# Patient Record
Sex: Male | Born: 1975 | Race: Black or African American | Hispanic: No | Marital: Married | Smoking: Former smoker
Health system: Southern US, Community
[De-identification: ages and names within clinical notes are randomized; demographics above are authoritative.]

## PROBLEM LIST (undated history)

## (undated) DIAGNOSIS — K219 Gastro-esophageal reflux disease without esophagitis: Secondary | ICD-10-CM

## (undated) DIAGNOSIS — R12 Heartburn: Secondary | ICD-10-CM

---

## 2009-07-09 ENCOUNTER — Emergency Department (HOSPITAL_BASED_OUTPATIENT_CLINIC_OR_DEPARTMENT_OTHER): Admission: EM | Admit: 2009-07-09 | Discharge: 2009-07-09 | Payer: Self-pay | Admitting: Emergency Medicine

## 2009-07-09 ENCOUNTER — Ambulatory Visit: Payer: Self-pay | Admitting: Diagnostic Radiology

## 2010-04-29 LAB — POCT CARDIAC MARKERS
Myoglobin, poc: 122 ng/mL (ref 12–200)
Troponin i, poc: <0.05 ng/mL (ref 0.00–0.09)

## 2010-04-29 LAB — CBC
HCT: 46.5 % (ref 39.0–52.0)
Hemoglobin: 15.4 g/dL (ref 13.0–17.0)
MCV: 83.6 fL (ref 78.0–100.0)
WBC: 13.8 10*3/uL — ABNORMAL HIGH (ref 4.0–10.5)

## 2010-04-29 LAB — DIFFERENTIAL
Basophils Absolute: 0.1 10*3/uL (ref 0.0–0.1)
Lymphs Abs: 0.9 10*3/uL (ref 0.7–4.0)
Monocytes Absolute: 0.7 10*3/uL (ref 0.1–1.0)
Monocytes Relative: 5 % (ref 3–12)
Neutro Abs: 12.1 10*3/uL — ABNORMAL HIGH (ref 1.7–7.7)
Neutrophils Relative %: 88 % — ABNORMAL HIGH (ref 43–77)

## 2010-04-29 LAB — COMPREHENSIVE METABOLIC PANEL
ALT: 12 U/L (ref 0–53)
Albumin: 4.4 g/dL (ref 3.5–5.2)
CO2: 22 mEq/L (ref 19–32)
Calcium: 9.6 mg/dL (ref 8.4–10.5)
GFR calc Af Amer: >60 mL/min (ref >60)
GFR calc non Af Amer: >60 mL/min (ref >60)
Total Bilirubin: 0.6 mg/dL (ref 0.3–1.2)
Total Protein: 7.7 g/dL (ref 6.0–8.3)

## 2013-05-07 ENCOUNTER — Encounter (HOSPITAL_BASED_OUTPATIENT_CLINIC_OR_DEPARTMENT_OTHER): Payer: Self-pay | Admitting: Emergency Medicine

## 2013-05-07 ENCOUNTER — Emergency Department (HOSPITAL_BASED_OUTPATIENT_CLINIC_OR_DEPARTMENT_OTHER)
Admission: EM | Admit: 2013-05-07 | Discharge: 2013-05-07 | Disposition: A | Discharge status: Home / Self Care | Payer: Self-pay | Attending: Emergency Medicine | Admitting: Emergency Medicine

## 2013-05-07 DIAGNOSIS — K5289 Other specified noninfective gastroenteritis and colitis: Secondary | ICD-10-CM | POA: Insufficient documentation

## 2013-05-07 DIAGNOSIS — R231 Pallor: Secondary | ICD-10-CM | POA: Insufficient documentation

## 2013-05-07 DIAGNOSIS — F17200 Nicotine dependence, unspecified, uncomplicated: Secondary | ICD-10-CM | POA: Insufficient documentation

## 2013-05-07 DIAGNOSIS — Z79899 Other long term (current) drug therapy: Secondary | ICD-10-CM | POA: Insufficient documentation

## 2013-05-07 DIAGNOSIS — R12 Heartburn: Secondary | ICD-10-CM | POA: Insufficient documentation

## 2013-05-07 DIAGNOSIS — K529 Noninfective gastroenteritis and colitis, unspecified: Secondary | ICD-10-CM

## 2013-05-07 HISTORY — DX: Heartburn: R12

## 2013-05-07 LAB — CBC WITH DIFFERENTIAL/PLATELET
BASOS ABS: 0 10*3/uL (ref 0.0–0.1)
Basophils Relative: 0 % (ref 0–1)
EOS ABS: 0 10*3/uL (ref 0.0–0.7)
EOS PCT: 0 % (ref 0–5)
HEMATOCRIT: 46.1 % (ref 39.0–52.0)
Hemoglobin: 15.9 g/dL (ref 13.0–17.0)
LYMPHS PCT: 6 % — AB (ref 12–46)
Lymphs Abs: 0.8 10*3/uL (ref 0.7–4.0)
MCH: 27.8 pg (ref 26.0–34.0)
MCHC: 34.5 g/dL (ref 30.0–36.0)
MCV: 80.7 fL (ref 78.0–100.0)
MONO ABS: 0.6 10*3/uL (ref 0.1–1.0)
Monocytes Relative: 5 % (ref 3–12)
NEUTROS PCT: 89 % — AB (ref 43–77)
Neutro Abs: 11.4 10*3/uL — ABNORMAL HIGH (ref 1.7–7.7)
Platelets: 235 10*3/uL (ref 150–400)
RBC: 5.71 MIL/uL (ref 4.22–5.81)
RDW: 12.7 % (ref 11.5–15.5)
WBC: 12.8 10*3/uL — AB (ref 4.0–10.5)

## 2013-05-07 LAB — COMPREHENSIVE METABOLIC PANEL
ALT: 14 U/L (ref 0–53)
AST: 35 U/L (ref 0–37)
Albumin: 4.8 g/dL (ref 3.5–5.2)
Alkaline Phosphatase: 89 U/L (ref 39–117)
BILIRUBIN TOTAL: 0.8 mg/dL (ref 0.3–1.2)
BUN: 15 mg/dL (ref 6–23)
CALCIUM: 10.4 mg/dL (ref 8.4–10.5)
CO2: 24 mEq/L (ref 19–32)
CREATININE: 1.1 mg/dL (ref 0.50–1.35)
Chloride: 99 mEq/L (ref 96–112)
GFR calc non Af Amer: 85 mL/min — ABNORMAL LOW (ref >90)
Glucose, Bld: 155 mg/dL — ABNORMAL HIGH (ref 70–99)
Potassium: 4.2 mEq/L (ref 3.7–5.3)
Sodium: 139 mEq/L (ref 137–147)
Total Protein: 8.5 g/dL — ABNORMAL HIGH (ref 6.0–8.3)

## 2013-05-07 LAB — LIPASE, BLOOD: LIPASE: 36 U/L (ref 11–59)

## 2013-05-07 MED ORDER — PROMETHAZINE HCL 25 MG PO TABS: 25.0000 mg | ORAL_TABLET | Freq: Four times a day (QID) | ORAL | Status: DC | PRN

## 2013-05-07 MED ORDER — GI COCKTAIL ~~LOC~~
30.0000 mL | Freq: Once | ORAL | Status: AC
  Administered 2013-05-07: 30 mL via ORAL

## 2013-05-07 MED ORDER — KETOROLAC TROMETHAMINE 30 MG/ML IJ SOLN
30.0000 mg | Freq: Once | INTRAMUSCULAR | Status: AC
  Administered 2013-05-07: 30 mg via INTRAVENOUS
  Filled 2013-05-07: qty 1

## 2013-05-07 MED ORDER — ONDANSETRON HCL 4 MG/2ML IJ SOLN
4.0000 mg | Freq: Once | INTRAMUSCULAR | Status: AC
  Administered 2013-05-07: 4 mg via INTRAVENOUS
  Filled 2013-05-07: qty 2

## 2013-05-07 MED ORDER — GI COCKTAIL ~~LOC~~
30.0000 mL | Freq: Once | ORAL | Status: AC
  Administered 2013-05-07: 30 mL via ORAL
  Filled 2013-05-07: qty 30

## 2013-05-07 MED ORDER — SODIUM CHLORIDE 0.9 % IV BOLUS (SEPSIS)
1000.0000 mL | Freq: Once | INTRAVENOUS | Status: AC
  Administered 2013-05-07: 1000 mL via INTRAVENOUS

## 2013-05-07 MED ORDER — GI COCKTAIL ~~LOC~~
ORAL | Status: AC
  Administered 2013-05-07: 30 mL via ORAL
  Filled 2013-05-07: qty 30

## 2013-05-07 NOTE — Discharge Instructions (Signed)
Phenergan as prescribed as needed for nausea.  Prilosec 20 mg twice daily for the next 2 weeks.  Return to the ER if you develop abdominal pain, bloody stool, or other new or concerning symptoms.   Viral Gastroenteritis Viral gastroenteritis is also known as stomach flu. This condition affects the stomach and intestinal tract. It can cause sudden diarrhea and vomiting. The illness typically lasts 3 to 8 days. Most people develop an immune response that eventually gets rid of the virus. While this natural response develops, the virus can make you quite ill. CAUSES  Many different viruses can cause gastroenteritis, such as rotavirus or noroviruses. You can catch one of these viruses by consuming contaminated food or water. You may also catch a virus by sharing utensils or other personal items with an infected person or by touching a contaminated surface. SYMPTOMS  The most common symptoms are diarrhea and vomiting. These problems can cause a severe loss of body fluids (dehydration) and a body salt (electrolyte) imbalance. Other symptoms may include:  Fever.  Headache.  Fatigue.  Abdominal pain. DIAGNOSIS  Your caregiver can usually diagnose viral gastroenteritis based on your symptoms and a physical exam. A stool sample may also be taken to test for the presence of viruses or other infections. TREATMENT  This illness typically goes away on its own. Treatments are aimed at rehydration. The most serious cases of viral gastroenteritis involve vomiting so severely that you are not able to keep fluids down. In these cases, fluids must be given through an intravenous line (IV). HOME CARE INSTRUCTIONS   Drink enough fluids to keep your urine clear or pale yellow. Drink small amounts of fluids frequently and increase the amounts as tolerated.  Ask your caregiver for specific rehydration instructions.  Avoid:  Foods high in sugar.  Alcohol.  Carbonated  drinks.  Tobacco.  Juice.  Caffeine drinks.  Extremely hot or cold fluids.  Fatty, greasy foods.  Too much intake of anything at one time.  Dairy products until 24 to 48 hours after diarrhea stops.  You may consume probiotics. Probiotics are active cultures of beneficial bacteria. They may lessen the amount and number of diarrheal stools in adults. Probiotics can be found in yogurt with active cultures and in supplements.  Wash your hands well to avoid spreading the virus.  Only take over-the-counter or prescription medicines for pain, discomfort, or fever as directed by your caregiver. Do not give aspirin to children. Antidiarrheal medicines are not recommended.  Ask your caregiver if you should continue to take your regular prescribed and over-the-counter medicines.  Keep all follow-up appointments as directed by your caregiver. SEEK IMMEDIATE MEDICAL CARE IF:   You are unable to keep fluids down.  You do not urinate at least once every 6 to 8 hours.  You develop shortness of breath.  You notice blood in your stool or vomit. This may look like coffee grounds.  You have abdominal pain that increases or is concentrated in one small area (localized).  You have persistent vomiting or diarrhea.  You have a fever.  The patient is a child younger than 3 months, and he or she has a fever.  The patient is a child older than 3 months, and he or she has a fever and persistent symptoms.  The patient is a child older than 3 months, and he or she has a fever and symptoms suddenly get worse.  The patient is a baby, and he or she has no tears when  crying. MAKE SURE YOU:   Understand these instructions.  Will watch your condition.  Will get help right away if you are not doing well or get worse. Document Released: 01/27/2005 Document Revised: 04/21/2011 Document Reviewed: 11/13/2010 [facility] Patient Information 2014 [place].

## 2013-05-07 NOTE — ED Provider Notes (Signed)
CSN: [id number]     Arrival date & time 05/07/13  1555 History   First MD Initiated Contact with Patient 05/07/13 1624    This chart was scribed for Jeremy Lyonsouglas Shwanda Soltis, MD by Marica OtterNusrat Rahman, ED Scribe. This patient was seen in room MH03/MH03 and the patient's care was started at 4:27 PM.  No primary provider on file.  Chief Complaint  Patient presents with  . Heartburn   The history is provided by the patient. No language interpreter was used.   HPI Comments: Jeremy HatchKevin Foster is a 38 y.o. male who presents to the Emergency Department complaining of intermittent abd pain onset earlier today. Pt describes the pain as a "pressure" sensation in the entirety of his abd. Pt also complains of associated nausea, chills, diaphoresis, vomiting and diarrhea. Pt reports he had two beers last night. Pt denies being a daily drinker and states he drinks predominately on the weekends. Pt reports he has not taken any food or fluids today. Pt is a current, every day smoker.   Sick Contact: Wife has had stomach pain onset one week ago.   Past Medical History  Diagnosis Date  . Heartburn    History reviewed. No pertinent past surgical history. No family history on file. History  Substance Use Topics  . Smoking status: Current Every Day Smoker    Types: Cigarettes  . Smokeless tobacco: Never Used  . Alcohol Use: 1.2 oz/week    2 Cans of beer per week    Review of Systems  Constitutional: Positive for chills.  Gastrointestinal: Positive for nausea, vomiting and diarrhea.   Allergies  Review of patient's allergies indicates no known allergies.  Home Medications   Current Outpatient Rx  Name  Route  Sig  Dispense  Refill  . ranitidine (ZANTAC) 75 MG tablet   Oral   Take 75 mg by mouth 2 (two) times daily.          BP 160/88  Pulse 56  Temp(Src) 98.2 F (36.8 C) (Oral)  Resp 24  SpO2 100% Physical Exam  Nursing note and vitals reviewed. Constitutional: He is oriented to person, place, and time.  He appears well-developed and well-nourished. No distress.  HENT:  Head: Normocephalic and atraumatic.  Eyes: EOM are normal.  Neck: Neck supple. No tracheal deviation present.  Cardiovascular: Normal rate.   Pulmonary/Chest: Effort normal. No respiratory distress.  Abdominal: Soft. Bowel sounds are normal. He exhibits no distension and no mass. There is no tenderness. There is no rebound and no guarding.  Mucous membrane moist.   Musculoskeletal: Normal range of motion.  Neurological: He is alert and oriented to person, place, and time.  Skin: Skin is warm and dry. There is pallor.  Psychiatric: He has a normal mood and affect. His behavior is normal.    ED Course  Procedures (including critical care time) DIAGNOSTIC STUDIES: Oxygen Saturation is 100% on RA, normal by my interpretation.    COORDINATION OF CARE: 4:32 PM-Discussed treatment plan which includes labs, meds, adminstering fluids via IVm,  with pt at bedside and pt agreed to plan.   Labs Review Labs Reviewed - No data to display Imaging Review No results found.   EKG Interpretation None      MDM   Final diagnoses:  None    Patient presents with complaints of burning in his stomach since 5:00 this morning. He has had vomiting since that time. He denies any fevers or chills. He has had some loose stools but denies  any blood. His symptoms sound like acute gastroenteritis with possible gastritis resulting from his vomiting. His workup is unremarkable and he is feeling better with fluids and anti-emetics. He appears appropriate for discharge. Understands to return if he develops bloody stool, worsening pain, or other new and concerning symptoms.  I personally performed the services described in this documentation, which was scribed in my presence. The recorded information has been reviewed and is accurate.       Jeremy Lyons, MD 05/07/13 2322

## 2013-05-07 NOTE — ED Notes (Signed)
Pt c/o heat burn since 0500- vomited x 11- states drank 2 beers last night- pt very anxious in triage

## 2015-08-28 ENCOUNTER — Emergency Department (HOSPITAL_BASED_OUTPATIENT_CLINIC_OR_DEPARTMENT_OTHER): Payer: BLUE CROSS/BLUE SHIELD

## 2015-08-28 ENCOUNTER — Emergency Department (HOSPITAL_BASED_OUTPATIENT_CLINIC_OR_DEPARTMENT_OTHER)
Admission: EM | Admit: 2015-08-28 | Discharge: 2015-08-28 | Disposition: A | Discharge status: Home / Self Care | Payer: BLUE CROSS/BLUE SHIELD | Attending: Emergency Medicine | Admitting: Emergency Medicine

## 2015-08-28 ENCOUNTER — Encounter (HOSPITAL_BASED_OUTPATIENT_CLINIC_OR_DEPARTMENT_OTHER): Payer: Self-pay | Admitting: *Deleted

## 2015-08-28 DIAGNOSIS — R1010 Upper abdominal pain, unspecified: Secondary | ICD-10-CM | POA: Diagnosis present

## 2015-08-28 DIAGNOSIS — F17210 Nicotine dependence, cigarettes, uncomplicated: Secondary | ICD-10-CM | POA: Insufficient documentation

## 2015-08-28 DIAGNOSIS — K2900 Acute gastritis without bleeding: Secondary | ICD-10-CM | POA: Diagnosis not present

## 2015-08-28 DIAGNOSIS — R0602 Shortness of breath: Secondary | ICD-10-CM | POA: Diagnosis not present

## 2015-08-28 LAB — COMPREHENSIVE METABOLIC PANEL
ALBUMIN: 4.8 g/dL (ref 3.5–5.0)
ALT: 15 U/L — ABNORMAL LOW (ref 17–63)
ANION GAP: 11 (ref 5–15)
AST: 35 U/L (ref 15–41)
Alkaline Phosphatase: 91 U/L (ref 38–126)
BILIRUBIN TOTAL: 0.8 mg/dL (ref 0.3–1.2)
BUN: 13 mg/dL (ref 6–20)
CO2: 23 mmol/L (ref 22–32)
Calcium: 9.7 mg/dL (ref 8.9–10.3)
Chloride: 102 mmol/L (ref 101–111)
Creatinine, Ser: 1.12 mg/dL (ref 0.61–1.24)
GFR calc Af Amer: >60 mL/min (ref >60)
Glucose, Bld: 157 mg/dL — ABNORMAL HIGH (ref 65–99)
POTASSIUM: 3.5 mmol/L (ref 3.5–5.1)
Sodium: 136 mmol/L (ref 135–145)
TOTAL PROTEIN: 8 g/dL (ref 6.5–8.1)

## 2015-08-28 LAB — CBC WITH DIFFERENTIAL/PLATELET
BASOS PCT: 0 %
Basophils Absolute: 0 10*3/uL (ref 0.0–0.1)
Eosinophils Absolute: 0 10*3/uL (ref 0.0–0.7)
Eosinophils Relative: 0 %
HEMATOCRIT: 43.9 % (ref 39.0–52.0)
Hemoglobin: 15.3 g/dL (ref 13.0–17.0)
Lymphocytes Relative: 8 %
Lymphs Abs: 0.9 10*3/uL (ref 0.7–4.0)
MCH: 27.9 pg (ref 26.0–34.0)
MCHC: 34.9 g/dL (ref 30.0–36.0)
MCV: 80.1 fL (ref 78.0–100.0)
MONO ABS: 0.6 10*3/uL (ref 0.1–1.0)
MONOS PCT: 6 %
NEUTROS ABS: 9.4 10*3/uL — AB (ref 1.7–7.7)
Neutrophils Relative %: 86 %
Platelets: 214 10*3/uL (ref 150–400)
RBC: 5.48 MIL/uL (ref 4.22–5.81)
RDW: 12.6 % (ref 11.5–15.5)
WBC: 10.9 10*3/uL — ABNORMAL HIGH (ref 4.0–10.5)

## 2015-08-28 LAB — LIPASE, BLOOD: LIPASE: 22 U/L (ref 11–51)

## 2015-08-28 MED ORDER — IOPAMIDOL (ISOVUE-300) INJECTION 61%
100.0000 mL | Freq: Once | INTRAVENOUS | Status: AC | PRN
  Administered 2015-08-28: 100 mL via INTRAVENOUS

## 2015-08-28 MED ORDER — PROMETHAZINE HCL 25 MG PO TABS: 25.0000 mg | ORAL_TABLET | Freq: Four times a day (QID) | ORAL | Status: DC | PRN

## 2015-08-28 MED ORDER — ONDANSETRON HCL 4 MG/2ML IJ SOLN
4.0000 mg | Freq: Once | INTRAMUSCULAR | Status: AC
  Administered 2015-08-28: 4 mg via INTRAVENOUS
  Filled 2015-08-28: qty 2

## 2015-08-28 MED ORDER — SUCRALFATE 1 G PO TABS: 1.0000 g | ORAL_TABLET | Freq: Three times a day (TID) | ORAL | Status: DC

## 2015-08-28 MED ORDER — FAMOTIDINE IN NACL 20-0.9 MG/50ML-% IV SOLN: 20.0000 mg | Freq: Once | INTRAVENOUS | Status: DC

## 2015-08-28 MED ORDER — SUCRALFATE 1 G PO TABS
1.0000 g | ORAL_TABLET | Freq: Once | ORAL | Status: AC
  Administered 2015-08-28: 1 g via ORAL
  Filled 2015-08-28: qty 1

## 2015-08-28 MED ORDER — SODIUM CHLORIDE 0.9 % IV BOLUS (SEPSIS)
1000.0000 mL | Freq: Once | INTRAVENOUS | Status: AC
  Administered 2015-08-28: 1000 mL via INTRAVENOUS

## 2015-08-28 MED ORDER — PROMETHAZINE HCL 25 MG/ML IJ SOLN
12.5000 mg | Freq: Once | INTRAMUSCULAR | Status: AC
  Administered 2015-08-28: 12.5 mg via INTRAVENOUS
  Filled 2015-08-28: qty 1

## 2015-08-28 MED ORDER — GI COCKTAIL ~~LOC~~
30.0000 mL | Freq: Once | ORAL | Status: AC
  Administered 2015-08-28: 30 mL via ORAL
  Filled 2015-08-28: qty 30

## 2015-08-28 MED FILL — PROMETHAZINE 25 MG TABLET: 25 | 3 days supply | Qty: 12 | Fill #0

## 2015-08-28 MED FILL — SUCRALFATE 1 GM TABLET: 1 | 7 days supply | Qty: 30 | Fill #0

## 2015-08-28 NOTE — Discharge Instructions (Signed)
You were seen and evaluated today for your upper abdominal pain. It appears this is secondary to inflammation in the lining of your stomach which is called gastritis. Please follow-up outpatient as this may need continued workup and evaluation.  Gastritis, Adult Gastritis is soreness and puffiness (inflammation) of the lining of the stomach. If you do not get help, gastritis can cause bleeding and sores (ulcers) in the stomach. HOME CARE   Only take medicine as told by your doctor.  If you were given antibiotic medicines, take them as told. Finish the medicines even if you start to feel better.  Drink enough fluids to keep your pee (urine) clear or pale yellow.  Avoid foods and drinks that make your problems worse. Foods you may want to avoid include:  Caffeine or alcohol.  Chocolate.  Mint.  Garlic and onions.  Spicy foods.  Citrus fruits, including oranges, lemons, or limes.  Food containing tomatoes, including sauce, chili, salsa, and pizza.  Fried and fatty foods.  Eat small meals throughout the day instead of large meals. GET HELP RIGHT AWAY IF:   You have black or dark red poop (stools).  You throw up (vomit) blood. It may look like coffee grounds.  You cannot keep fluids down.  Your belly (abdominal) pain gets worse.  You have a fever.  You do not feel better after 1 week.  You have any other questions or concerns. MAKE SURE YOU:   Understand these instructions.  Will watch your condition.  Will get help right away if you are not doing well or get worse.   This information is not intended to replace advice given to you by your health care provider. Make sure you discuss any questions you have with your health care provider.   Document Released: 07/16/2007 Document Revised: 04/21/2011 Document Reviewed: 03/12/2011 Elsevier Interactive Patient Education [employer]2016 [employer]

## 2015-08-28 NOTE — ED Notes (Signed)
Pt states he is unable to provide urine sample. Will let me know when he feels he can void again.

## 2015-08-28 NOTE — ED Notes (Signed)
Pt reports anxiety, feeling like he can't catch his breath, and his acid reflux burning to his abd area. Denies any chest pressure, pain or sensation, pt is very restless, moving arms and legs, will not hold arm still for bp even when being soothed and assisted by this rn. Pt states "I drank 2 shots of booze yesterday and smoked some weed to help my reflux, but it's never made me feel like this." pt denies any further drug use or c/o at this time.

## 2015-08-28 NOTE — ED Notes (Signed)
Pt is sleeping and in NAD

## 2015-08-28 NOTE — ED Notes (Signed)
Pt reports having moderate bm "green and slimy". Pt states that he feels better.

## 2015-08-28 NOTE — ED Notes (Signed)
EMT asked pt if he could give us a urine, pt stated he still couldn't at this time.

## 2015-08-28 NOTE — ED Notes (Signed)
Gi cocktail held as pt has dry heaves. States he will notify me when he feels he is able to take gi cocktail.

## 2015-08-28 NOTE — ED Notes (Signed)
Pt states that he voided but was unable to provide sample, despite being given both a urinal and a specimen cup in the restroom.

## 2015-08-28 NOTE — ED Provider Notes (Signed)
CSN: [id number]     Arrival date & time 08/28/15  0703 History   First MD Initiated Contact with Patient 08/28/15 [phone]     Chief Complaint  Patient presents with  . Shortness of Breath     (Consider location/radiation/quality/duration/timing/severity/associated sxs/prior Treatment) HPI Comments: 40 year old male with history of indigestion presents for upper abdominal pain. The patient states that his symptoms started yesterday and that initially just felt like his previous episodes of indigestion. He does report that he drank alcohol to try to make the symptoms better. He reports he has been having pain in his upper abdomen and that it has been feeling bloated since last night. He says he has been very nauseous and vomiting and been in a lot of pain. He states he has also been having loose green stools since last night. He says he was trying to tough it out at home but couldn't any longer and so his fianc dropped him off here on her way to work. He denies fevers or chills. He denies any known sick contacts.  Patient is a 40 y.o. male presenting with shortness of breath.  Shortness of Breath Associated symptoms: abdominal pain and vomiting   Associated symptoms: no chest pain, no cough, no fever, no headaches, no rash and no wheezing     Past Medical History  Diagnosis Date  . Heartburn    History reviewed. No pertinent past surgical history. History reviewed. No pertinent family history. Social History  Substance Use Topics  . Smoking status: Current Every Day Smoker    Types: Cigarettes  . Smokeless tobacco: Never Used  . Alcohol Use: 1.2 oz/week    2 Cans of beer per week    Review of Systems  Constitutional: Positive for appetite change and fatigue. Negative for fever.  HENT: Negative for congestion, postnasal drip, rhinorrhea and sinus pressure.   Eyes: Negative for visual disturbance.  Respiratory: Positive for shortness of breath. Negative for cough, chest tightness and  wheezing.   Cardiovascular: Negative for chest pain, palpitations and leg swelling.  Gastrointestinal: Positive for nausea, vomiting, abdominal pain and diarrhea. Negative for constipation.  Genitourinary: Negative for dysuria, urgency and hematuria.  Musculoskeletal: Negative for myalgias and back pain.  Skin: Negative for rash.  Neurological: Negative for dizziness, weakness, light-headedness and headaches.  Hematological: Does not bruise/bleed easily.      Allergies  Review of patient's allergies indicates no known allergies.  Home Medications   Prior to Admission medications   Medication Sig Start Date End Date Taking? Authorizing Provider  promethazine (PHENERGAN) 25 MG tablet Take 1 tablet (25 mg total) by mouth every 6 (six) hours as needed for nausea. 08/28/15   Leta BaptistEmily Roe Srihari Shellhammer, MD  ranitidine (ZANTAC) 75 MG tablet Take 75 mg by mouth 2 (two) times daily.    Historical Provider, MD  sucralfate (CARAFATE) 1 g tablet Take 1 tablet (1 g total) by mouth 4 (four) times daily -  with meals and at bedtime. 08/28/15   Leta BaptistEmily Roe Nickolaus Bordelon, MD   BP 158/77 mmHg  Pulse 55  Temp(Src) 97.9 F (36.6 C) (Oral)  Resp 18  Ht 5\' 9"  (1.753 m)  Wt 220 lb (99.791 kg)  BMI 32.47 kg/m2  SpO2 100% Physical Exam  Constitutional: He is oriented to person, place, and time. He appears well-developed and well-nourished. No distress.  HENT:  Head: Normocephalic and atraumatic.  Right Ear: External ear normal.  Left Ear: External ear normal.  Mouth/Throat: Oropharynx is clear and moist.  No oropharyngeal exudate.  Eyes: EOM are normal. Pupils are equal, round, and reactive to light.  Neck: Normal range of motion. Neck supple.  Cardiovascular: Normal rate, regular rhythm, normal heart sounds and intact distal pulses.   No murmur heard. Pulmonary/Chest: Effort normal. No respiratory distress. He has no wheezes. He has no rales.  Abdominal: Soft. He exhibits no distension. There is no tenderness.   Musculoskeletal: He exhibits no edema.  Neurological: He is alert and oriented to person, place, and time.  Skin: Skin is warm and dry. No rash noted. He is not diaphoretic.  Vitals reviewed.   ED Course  Procedures (including critical care time) Labs Review Labs Reviewed  CBC WITH DIFFERENTIAL/PLATELET - Abnormal; Notable for the following:    WBC 10.9 (*)    Neutro Abs 9.4 (*)    All other components within normal limits  COMPREHENSIVE METABOLIC PANEL - Abnormal; Notable for the following:    Glucose, Bld 157 (*)    ALT 15 (*)    All other components within normal limits  LIPASE, BLOOD    Imaging Review Ct Abdomen Pelvis W Contrast  08/28/2015  CLINICAL DATA:  Anxiety. Shortness of breath. Epigastric and upper abdominal pain. Restlessness. EXAM: CT ABDOMEN AND PELVIS WITH CONTRAST TECHNIQUE: Multidetector CT imaging of the abdomen and pelvis was performed using the standard protocol following bolus administration of intravenous contrast. CONTRAST:  ISOVUE-300 IOPAMIDOL (ISOVUE-300) INJECTION 61% COMPARISON:  Multiple exams, including 08/28/2015 radiographs and 07/09/2009 ultrasound FINDINGS: Lower chest: Unremarkable. No distal esophageal wall thickening identified. Hepatobiliary: Mild diffuse hepatic steatosis. Pancreas: Unremarkable Spleen: Unremarkable Adrenals/Urinary Tract: Unremarkable Stomach/Bowel: Unremarkable.  Appendix normal. Vascular/Lymphatic: Unremarkable Reproductive: Unremarkable Other: No supplemental non-categorized findings. Musculoskeletal: Disc bulge at the L4-5 level without overt impingement. IMPRESSION: 1. No acute findings to explain the patient's symptoms. 2. Mild disc bulge at L4-5. 3. Suspected mild diffuse hepatic steatosis. Electronically Signed   By: Gaylyn Rong M.D.   On: 08/28/2015 12:46   Dg Abd Acute W/chest  08/28/2015  CLINICAL DATA:  Abdominal pain and burning.  Reflux. EXAM: DG ABDOMEN ACUTE W/ 1V CHEST COMPARISON:  None. FINDINGS:  Bowel gas pattern is normal without evidence of ileus, obstruction or free air. No abnormal calcifications or bone findings. One-view chest shows normal heart and mediastinal shadows. The lungs are clear. No bony abnormality. No free air. IMPRESSION: Negative abdominal radiographs.  No acute cardiopulmonary disease. Electronically Signed   By: Paulina Fusi M.D.   On: 08/28/2015 08:04   I have personally reviewed and evaluated these images and lab results as part of my medical decision-making.   EKG Interpretation   Date/Time:  Tuesday August 28 2015 07:48:11 EDT Ventricular Rate:  56 PR Interval:    QRS Duration: 107 QT Interval:  428 QTC Calculation: 413 R Axis:   87 Text Interpretation:  Sinus rhythm Atrial premature complex Left  ventricular hypertrophy No significant change since last tracing Confirmed  by Revia Nghiem ([id number]) on 08/28/2015 8:04:18 AM      MDM  Patient was seen and evaluated in stable condition. History and physical examination most consistent with gastritis. Patient was treated with GI cocktail and Zofran. He was also provided with IV fluids. He felt improved initially and was able to tolerate some oral fluids but then had sudden increase in pain. Because of continued pain after initial treatment CT was ordered which was negative for acute process. Patient was redosed with Phenergan and Carafate and again felt significantly improved. He was  discharged home in stable condition with a prescription for Carafate and Phenergan. He was instructed to follow-up outpatient and does not drink alcohol area and strictreturn precautions were given. Final diagnoses:  Acute gastritis without hemorrhage    1. Acute gastritis    Leta Baptist, MD 08/29/15 [phone]

## 2017-03-23 ENCOUNTER — Other Ambulatory Visit: Payer: Self-pay

## 2017-03-23 ENCOUNTER — Encounter (HOSPITAL_BASED_OUTPATIENT_CLINIC_OR_DEPARTMENT_OTHER): Payer: Self-pay | Admitting: *Deleted

## 2017-03-23 ENCOUNTER — Emergency Department (HOSPITAL_BASED_OUTPATIENT_CLINIC_OR_DEPARTMENT_OTHER)
Admission: EM | Admit: 2017-03-23 | Discharge: 2017-03-23 | Disposition: A | Discharge status: Home / Self Care | Payer: BLUE CROSS/BLUE SHIELD | Attending: Emergency Medicine | Admitting: Emergency Medicine

## 2017-03-23 ENCOUNTER — Emergency Department (HOSPITAL_BASED_OUTPATIENT_CLINIC_OR_DEPARTMENT_OTHER): Payer: BLUE CROSS/BLUE SHIELD

## 2017-03-23 DIAGNOSIS — Y999 Unspecified external cause status: Secondary | ICD-10-CM | POA: Insufficient documentation

## 2017-03-23 DIAGNOSIS — F17210 Nicotine dependence, cigarettes, uncomplicated: Secondary | ICD-10-CM | POA: Insufficient documentation

## 2017-03-23 DIAGNOSIS — Y939 Activity, unspecified: Secondary | ICD-10-CM | POA: Diagnosis not present

## 2017-03-23 DIAGNOSIS — S6992XA Unspecified injury of left wrist, hand and finger(s), initial encounter: Secondary | ICD-10-CM | POA: Diagnosis present

## 2017-03-23 DIAGNOSIS — Y929 Unspecified place or not applicable: Secondary | ICD-10-CM | POA: Insufficient documentation

## 2017-03-23 DIAGNOSIS — S61312A Laceration without foreign body of right middle finger with damage to nail, initial encounter: Secondary | ICD-10-CM | POA: Insufficient documentation

## 2017-03-23 DIAGNOSIS — S62663A Nondisplaced fracture of distal phalanx of left middle finger, initial encounter for closed fracture: Secondary | ICD-10-CM | POA: Insufficient documentation

## 2017-03-23 DIAGNOSIS — Z23 Encounter for immunization: Secondary | ICD-10-CM | POA: Insufficient documentation

## 2017-03-23 DIAGNOSIS — W3189XA Contact with other specified machinery, initial encounter: Secondary | ICD-10-CM | POA: Insufficient documentation

## 2017-03-23 DIAGNOSIS — Z79899 Other long term (current) drug therapy: Secondary | ICD-10-CM | POA: Insufficient documentation

## 2017-03-23 HISTORY — DX: Gastro-esophageal reflux disease without esophagitis: K21.9

## 2017-03-23 MED ORDER — CEPHALEXIN 500 MG PO CAPS: 500.0000 mg | ORAL_CAPSULE | Freq: Four times a day (QID) | ORAL | 0 refills | Status: DC

## 2017-03-23 MED ORDER — TETANUS-DIPHTH-ACELL PERTUSSIS 5-2.5-18.5 LF-MCG/0.5 IM SUSP
0.5000 mL | Freq: Once | INTRAMUSCULAR | Status: AC
  Administered 2017-03-23: 0.5 mL via INTRAMUSCULAR
  Filled 2017-03-23: qty 0.5

## 2017-03-23 NOTE — ED Triage Notes (Signed)
Pt c/o left middle finger injury x 8 hrs ago

## 2017-03-23 NOTE — ED Notes (Signed)
Verbal understanding of d/c instructions and to call Ortho in am for appt tomorrow

## 2017-03-23 NOTE — ED Provider Notes (Signed)
[facility] EMERGENCY DEPARTMENT Provider Note   CSN: [id number] Arrival date & time: 03/23/17  1756     History   Chief Complaint Chief Complaint  Patient presents with  . Finger Injury    HPI  Jeremy Foster is a 42 y.o. Male with a history of GERD, who presents to the ED for evaluation of a finger injury approximately 8 hours prior to arrival.  Patient reports he was assembling cabinets, working with some sort of drill to create a groove in the wood. The drill went into his finger injury the corner of the nailbed and nail matrix on the left middle finger.  Patient feels like some piece of the nail went down into the finger. Patient is able to bend and extend the finger with some discomfort, reports some tingling sensation in the tip of the finger.  No other injuries from the drill.  Patient unsure when his last tetanus shot was.      Past Medical History:  Diagnosis Date  . GERD (gastroesophageal reflux disease)   . Heartburn     There are no active problems to display for this patient.   History reviewed. No pertinent surgical history.     Home Medications    Prior to Admission medications   Medication Sig Start Date End Date Taking? Authorizing Provider  ranitidine (ZANTAC) 75 MG tablet Take 75 mg by mouth 2 (two) times daily.    [provider]  sucralfate (CARAFATE) 1 g tablet Take 1 tablet (1 g total) by mouth 4 (four) times daily -  with meals and at bedtime. 08/28/15   Jeremy Foster, Jeremy Roe, MD    Family History History reviewed. No pertinent family history.  Social History Social History   Tobacco Use  . Smoking status: Current Every Day Smoker    Types: Cigarettes  . Smokeless tobacco: Never Used  Substance Use Topics  . Alcohol use: Yes    Alcohol/week: 1.2 oz    Types: 2 Cans of beer per week  . Drug use: Yes    Types: Marijuana     Allergies   Patient has no known allergies.   Review of Systems Review of Systems    Constitutional: Negative for chills and fever.  Musculoskeletal:       Left middle finger injury  Skin: Positive for wound.  Neurological: Negative for weakness and numbness.     Physical Exam Updated Vital Signs BP 137/89   Pulse 82   Temp 98.6 F (37 C)   Resp 16   Ht 5\' 9"  (1.753 m)   Wt 98.9 kg (218 lb)   SpO2 98%   BMI 32.19 kg/m   Physical Exam  Constitutional: He appears well-developed and well-nourished. No distress.  HENT:  Head: Normocephalic and atraumatic.  Eyes: Right eye exhibits no discharge. Left eye exhibits no discharge.  Pulmonary/Chest: Effort normal. No respiratory distress.  Musculoskeletal:  Laceration of the left middle finger, involving the corner of the nailbed and extending across the nail matrix, is also another small nick over the nail, no evidence of injury on the palmar surface of the finger, extension and flexion strength intact at the DIP, PIP and MCP, sensation intact, 2+ radial pulse and good capillary refill, no evidence of any other injuries to the fingers  Neurological: He is alert. Coordination normal.  Skin: He is not diaphoretic.  Psychiatric: He has a normal mood and affect. His behavior is normal.  Nursing note and vitals reviewed.  ED Treatments / Results  Labs (all labs ordered are listed, but only abnormal results are displayed) Labs Reviewed - No data to display  EKG  EKG Interpretation None       Radiology Dg Finger Middle Left  Result Date: 03/23/2017 CLINICAL DATA:  Crush injury and laceration to the left third finger. EXAM: LEFT MIDDLE FINGER 2+V COMPARISON:  None. FINDINGS: There is a suggestion of a tiny nondisplaced dorsal plate fracture at the base of the distal phalanx in the left third finger, seen only on the lateral view. No additional fracture. No dislocation. No suspicious focal osseous lesion. No radiopaque foreign body in the left third finger. Metallic ring obscures visualization of the  proximal left fourth finger on the AP view. IMPRESSION: Suggestion of a tiny nondisplaced dorsal plate fracture at the base of the distal phalanx in the left third finger. Electronically Signed   By: Jeremy Foster M.D.   On: 03/23/2017 18:30    Procedures Procedures (including critical care time)  Medications Ordered in ED Medications  Tdap (BOOSTRIX) injection 0.5 mL (0.5 mLs Intramuscular Given 03/23/17 2207)     Initial Impression / Assessment and Plan / ED Course  I have reviewed the triage vital signs and the nursing notes.  Pertinent labs & imaging results that were available during my care of the patient were reviewed by me and considered in my medical decision making (see chart for details).  Pt presents for evaluation of injury to the left middle finger with a drill, there is injury to the corner of the nail bed crossing the nail matrix, x-ray shows evidence of underlying nondisplaced fracture of the dorsal plate of the distal phalanx.  Finger soaked and cleaned by nursing prior to my evaluation.  We will consult Dr. Orlan Foster with hand surgery for recommendations.  Tetanus updated while here in the ED.  10:19 PM Discussed case with Dr. Melvyn Foster, he suggested repairing the naibed in the ED, versus irrigating the wound copiously and having the patient follow up with him in the office tomorrow morning, I discussed with him that I feel the tissue is macerated where the drill went in and there is not much to repair, plan is to copiously irrigate, cover with dressing and splint, and have pt follow up in the morning.  Will irrigate with additional liter of normal saline, apply bulky dressing and splint.  Stable for discharge home, prescription for Keflex provided, patient to follow-up with Dr. Melvyn Foster tomorrow morning.  He is instructed to keep dressing and splint on until he is seen by hand surgery.  Return precautions discussed.  Patient expresses understanding and is in agreement with plan.   Work note provided.   Final Clinical Impressions(s) / ED Diagnoses   Final diagnoses:  Laceration of right middle finger without foreign body with damage to nail, initial encounter  Closed nondisplaced fracture of distal phalanx of left middle finger, initial encounter    ED Discharge Orders        Ordered    cephALEXin (KEFLEX) 500 MG capsule  4 times daily     03/23/17 2232       Dartha Lodge, Cordelia Poche 03/23/17 2307    Tegeler, Canary Brim, MD 03/24/17 [phone]

## 2017-03-23 NOTE — Discharge Instructions (Signed)
Take antibiotics as directed unless otherwise instructed by Dr. Melvyn Novasrtmann, will need to follow-up in Dr. Glenna Durandrtmann's office tomorrow morning please call his office first thing in the morning.  Please keep dressing on finger and remain in splint until you are seen by him.  If you have significant redness, swelling or drainage from the wound before you are seen by Dr. Orlan Leavensrtman or other new or concerning symptoms return to the ED.

## 2019-09-06 IMAGING — DX DG FINGER MIDDLE 2+V*L*
3 series · 3 of 3 positions shown · non-contrast
Comparison: None.

CLINICAL DATA: Crush injury and laceration to the left third
finger.

EXAM:
LEFT MIDDLE FINGER 2+V

[finger ap]
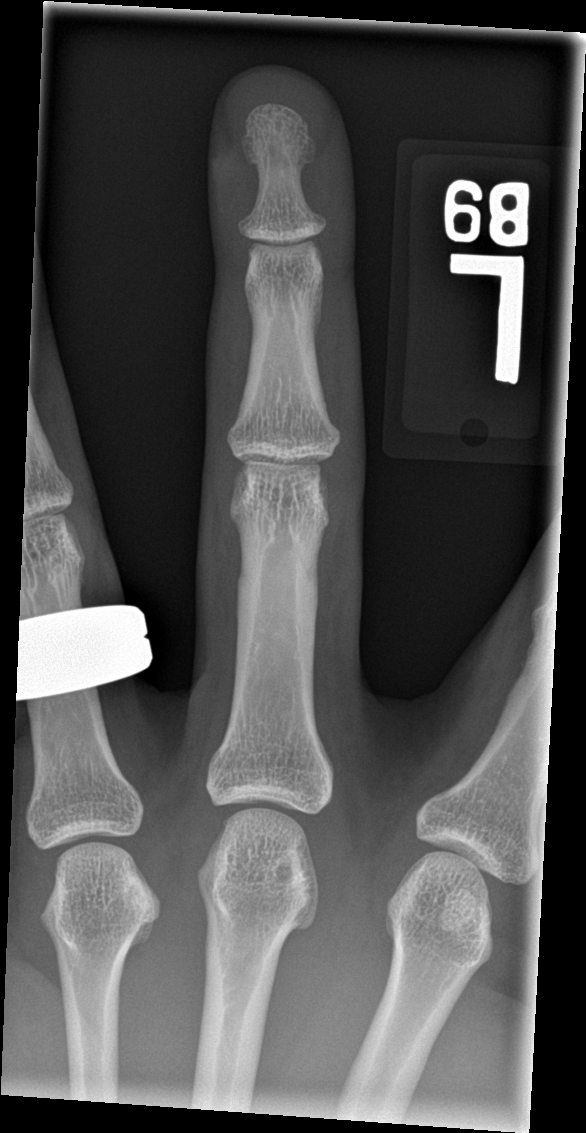

[finger obl]
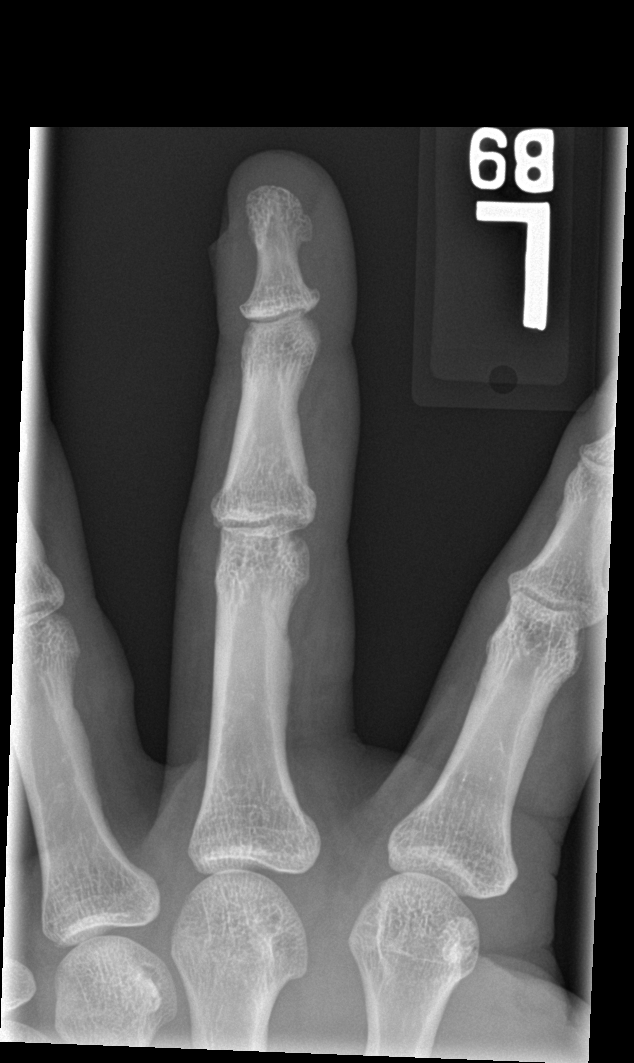

[finger lat]
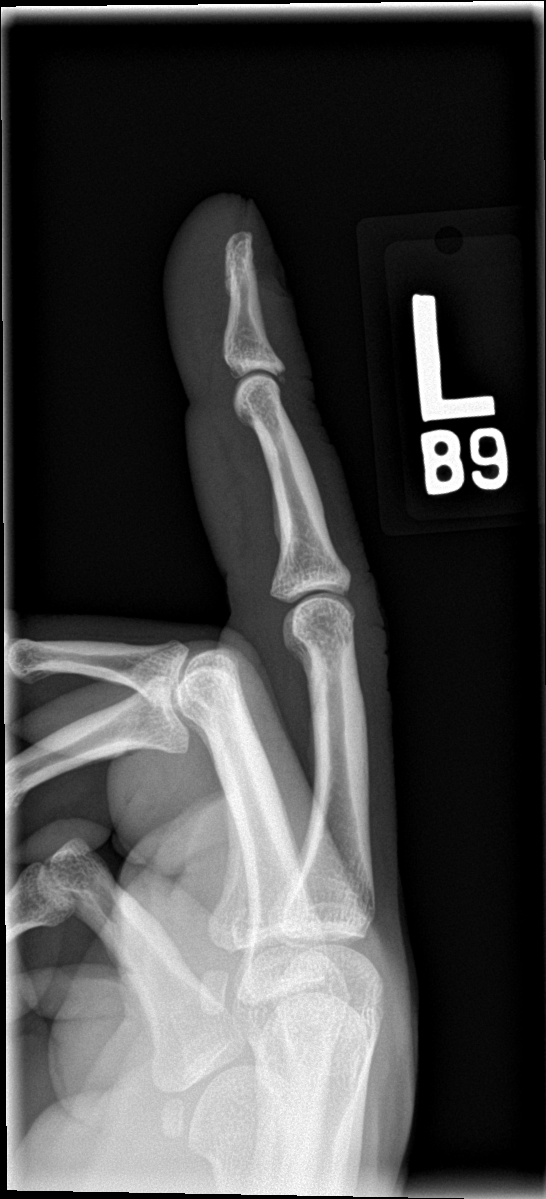

[3 of 3 positions shown; findings below may reference images not displayed]

FINDINGS: There is a suggestion of a tiny nondisplaced dorsal plate fracture
at the base of the distal phalanx in the left third finger, seen
only on the lateral view. No additional fracture. No dislocation. No
suspicious focal osseous lesion. No radiopaque foreign body in the
left third finger. Metallic ring obscures visualization of the
proximal left fourth finger on the AP view.
IMPRESSION: Suggestion of a tiny nondisplaced dorsal plate fracture at the base
of the distal phalanx in the left third finger.

## 2019-11-09 ENCOUNTER — Other Ambulatory Visit: Payer: Self-pay

## 2019-11-09 ENCOUNTER — Encounter (HOSPITAL_BASED_OUTPATIENT_CLINIC_OR_DEPARTMENT_OTHER): Payer: Self-pay | Admitting: Emergency Medicine

## 2019-11-09 ENCOUNTER — Emergency Department (HOSPITAL_BASED_OUTPATIENT_CLINIC_OR_DEPARTMENT_OTHER)
Admission: EM | Admit: 2019-11-09 | Discharge: 2019-11-10 | Disposition: A | Discharge status: Home / Self Care | Payer: BC Managed Care – PPO | Attending: Emergency Medicine | Admitting: Emergency Medicine

## 2019-11-09 DIAGNOSIS — R112 Nausea with vomiting, unspecified: Secondary | ICD-10-CM | POA: Diagnosis not present

## 2019-11-09 DIAGNOSIS — Z87891 Personal history of nicotine dependence: Secondary | ICD-10-CM | POA: Diagnosis not present

## 2019-11-09 DIAGNOSIS — R1013 Epigastric pain: Secondary | ICD-10-CM | POA: Diagnosis present

## 2019-11-09 LAB — CBC
HCT: 47 % (ref 39.0–52.0)
Hemoglobin: 15.5 g/dL (ref 13.0–17.0)
MCH: 27.4 pg (ref 26.0–34.0)
MCHC: 33 g/dL (ref 30.0–36.0)
MCV: 83.2 fL (ref 80.0–100.0)
Platelets: 244 10*3/uL (ref 150–400)
RBC: 5.65 MIL/uL (ref 4.22–5.81)
RDW: 12.6 % (ref 11.5–15.5)
WBC: 15.3 10*3/uL — ABNORMAL HIGH (ref 4.0–10.5)
nRBC: 0 % (ref 0.0–0.2)

## 2019-11-09 LAB — COMPREHENSIVE METABOLIC PANEL
ALT: 14 U/L (ref 0–44)
AST: 25 U/L (ref 15–41)
Albumin: 4.9 g/dL (ref 3.5–5.0)
Alkaline Phosphatase: 71 U/L (ref 38–126)
Anion gap: 13 (ref 5–15)
BUN: 14 mg/dL (ref 6–20)
CO2: 26 mmol/L (ref 22–32)
Calcium: 9.7 mg/dL (ref 8.9–10.3)
Chloride: 97 mmol/L — ABNORMAL LOW (ref 98–111)
Creatinine, Ser: 1.09 mg/dL (ref 0.61–1.24)
GFR calc Af Amer: >60 mL/min (ref >60)
GFR calc non Af Amer: >60 mL/min (ref >60)
Glucose, Bld: 150 mg/dL — ABNORMAL HIGH (ref 70–99)
Potassium: 4 mmol/L (ref 3.5–5.1)
Sodium: 136 mmol/L (ref 135–145)
Total Bilirubin: 0.7 mg/dL (ref 0.3–1.2)
Total Protein: 7.9 g/dL (ref 6.5–8.1)

## 2019-11-09 LAB — LIPASE, BLOOD: Lipase: 76 U/L — ABNORMAL HIGH (ref 11–51)

## 2019-11-09 MED ORDER — ONDANSETRON HCL 4 MG/2ML IJ SOLN
4.0000 mg | Freq: Once | INTRAMUSCULAR | Status: AC
  Administered 2019-11-09: 4 mg via INTRAVENOUS
  Filled 2019-11-09: qty 2

## 2019-11-09 MED ORDER — ONDANSETRON 4 MG PO TBDP
4.0000 mg | ORAL_TABLET | Freq: Once | ORAL | Status: AC | PRN
  Administered 2019-11-09: 4 mg via ORAL
  Filled 2019-11-09: qty 1

## 2019-11-09 MED ORDER — SODIUM CHLORIDE 0.9 % IV BOLUS
1000.0000 mL | Freq: Once | INTRAVENOUS | Status: AC
  Administered 2019-11-09: 1000 mL via INTRAVENOUS

## 2019-11-09 NOTE — ED Triage Notes (Signed)
Vomiting since last night after eating Nachos with chili at a football game. Sts "they didn't taste right but I was hungry." Periods of vomiting all day.  Sweating.  Pt having a hard time sitting still.  Not severe abd pain but epigastric "churning."

## 2019-11-09 NOTE — ED Triage Notes (Signed)
Vomiting since last night after eating Nachos with chili last night at a football game. Periods of vomiting all day.  Sweating.  Pt having a hard time sitting still.  Not severe abd pain but epigastric "churning."

## 2019-11-09 NOTE — ED Provider Notes (Signed)
[facility] Provider Note: Jeremy Dell, MD, FACEP  CSN: [id number] MRN: [id number] ARRIVAL: 11/09/19 at 1837 ROOM: [facility]09/[facility]09   CHIEF COMPLAINT  Vomiting   HISTORY OF PRESENT ILLNESS  11/09/19 11:39 PM Jeremy Foster is a 44 y.o. male who was at a football game yesterday evening where he ate some not shows that he see did not think tasted right.  He subsequently developed nausea and vomiting along with a discomfort in his epigastrium.  He describes the discomfort as a "flipping" or "churning".  He has had sweating but no diarrhea.  The vomiting has come in waves.  He is feeling better right now he denies any current epigastric discomfort.  He has not had a fever.  He was given Zofran in triage but vomited it back up.  He rated the epigastric discomfort as a 6 out of 10 earlier.    Past Medical History:  Diagnosis Date  . GERD (gastroesophageal reflux disease)   . Heartburn     History reviewed. No pertinent surgical history.  No family history on file.  Social History   Tobacco Use  . Smoking status: Former Smoker    Types: Cigarettes  . Smokeless tobacco: Never Used  Substance Use Topics  . Alcohol use: Not Currently    Alcohol/week: 2.0 standard drinks    Types: 2 Cans of beer per week    Comment: no alcohol in 3 years  . Drug use: Yes    Types: Marijuana    Prior to Admission medications   Medication Sig Start Date End Date Taking? Authorizing Provider  ondansetron (ZOFRAN ODT) 8 MG disintegrating tablet Take 1 tablet (8 mg total) by mouth every 8 (eight) hours as needed for nausea or vomiting. 11/10/19   Shawnice Tilmon, Jonny Ruiz, MD    Allergies Patient has no known allergies.   REVIEW OF SYSTEMS  Negative except as noted here or in the History of Present Illness.   PHYSICAL EXAMINATION  Initial Vital Signs Blood pressure (!) 148/86, pulse 62, temperature 98.2 F (36.8 C), resp. rate 16, height 5\' 9"  (1.753 m), weight 92.1 kg, SpO2 100  %.  Examination General: Well-developed, well-nourished male in no acute distress; appearance consistent with age of record HENT: normocephalic; atraumatic Eyes: pupils equal, round and reactive to light; extraocular muscles intact Neck: supple Heart: regular rate and rhythm Lungs: clear to auscultation bilaterally Abdomen: soft; nondistended; nontender; bowel sounds present Extremities: No deformity; full range of motion; pulses normal Neurologic: Awake, alert and oriented; motor function intact in all extremities and symmetric; no facial droop Skin: Warm and dry Psychiatric: Flat affect   RESULTS  Summary of this visit's results, reviewed and interpreted by myself:   EKG Interpretation  Date/Time:    Ventricular Rate:    PR Interval:    QRS Duration:   QT Interval:    QTC Calculation:   R Axis:     Text Interpretation:        Laboratory Studies: Results for orders placed or performed during the hospital encounter of 11/09/19 (from the past 24 hour(s))  Lipase, blood     Status: Abnormal   Collection Time: 11/09/19  9:53 PM  Result Value Ref Range   Lipase 76 (H) 11 - 51 U/L  Comprehensive metabolic panel     Status: Abnormal   Collection Time: 11/09/19  9:53 PM  Result Value Ref Range   Sodium 136 135 - 145 mmol/L   Potassium 4.0 3.5 - 5.1 mmol/L  Chloride 97 (L) 98 - 111 mmol/L   CO2 26 22 - 32 mmol/L   Glucose, Bld 150 (H) 70 - 99 mg/dL   BUN 14 6 - 20 mg/dL   Creatinine, Ser 0.73 0.61 - 1.24 mg/dL   Calcium 9.7 8.9 - 71.0 mg/dL   Total Protein 7.9 6.5 - 8.1 g/dL   Albumin 4.9 3.5 - 5.0 g/dL   AST 25 15 - 41 U/L   ALT 14 0 - 44 U/L   Alkaline Phosphatase 71 38 - 126 U/L   Total Bilirubin 0.7 0.3 - 1.2 mg/dL   GFR calc non Af Amer >60 >60 mL/min   GFR calc Af Amer >60 >60 mL/min   Anion gap 13 5 - 15  CBC     Status: Abnormal   Collection Time: 11/09/19  9:53 PM  Result Value Ref Range   WBC 15.3 (H) 4.0 - 10.5 K/uL   RBC 5.65 4.22 - 5.81 MIL/uL    Hemoglobin 15.5 13.0 - 17.0 g/dL   HCT 62.6 39 - 52 %   MCV 83.2 80.0 - 100.0 fL   MCH 27.4 26.0 - 34.0 pg   MCHC 33.0 30.0 - 36.0 g/dL   RDW 94.8 54.6 - 27.0 %   Platelets 244 150 - 400 K/uL   nRBC 0.0 0.0 - 0.2 %   Imaging Studies: No results found.  ED COURSE and MDM  Nursing notes, initial and subsequent vitals signs, including pulse oximetry, reviewed and interpreted by myself.  Vitals:   11/09/19 1909 11/09/19 2325 11/10/19 0033 11/10/19 0144  BP: (!) 148/108 (!) 148/86 140/86 140/76  Pulse: 98 62 64 (!) 55  Resp: 16 16 15 16   Temp: 98.2 F (36.8 C) 98.2 F (36.8 C)  98.9 F (37.2 C)  TempSrc: Oral   Oral  SpO2: 99% 100% 100% 99%  Weight: 92.1 kg     Height: 5\' 9"  (1.753 m)      Medications  ondansetron (ZOFRAN-ODT) disintegrating tablet 4 mg (4 mg Oral Given 11/09/19 1918)  sodium chloride 0.9 % bolus 1,000 mL (0 mLs Intravenous Stopped 11/10/19 0034)  ondansetron (ZOFRAN) injection 4 mg (4 mg Intravenous Given 11/09/19 2357)  ketorolac (TORADOL) 15 MG/ML injection 15 mg (15 mg Intravenous Given 11/10/19 0143)   2:36 AM Patient drinking fluids without vomiting.  Sleeping comfortably.  Although his lipase is slightly elevated he is having no abdominal pain or tenderness at this time so this may be a false positive.   PROCEDURES  Procedures   ED DIAGNOSES     ICD-10-CM   1. Nausea and vomiting in adult  R11.2   2. Epigastric discomfort  R10.13        [name], 11/12/19, MD 11/10/19 [phone]

## 2019-11-09 NOTE — ED Notes (Signed)
Pt returned to ED.  Dismiss reversed.

## 2019-11-10 MED ORDER — ONDANSETRON 8 MG PO TBDP: 8.0000 mg | ORAL_TABLET | Freq: Three times a day (TID) | ORAL | 0 refills | Status: AC | PRN

## 2019-11-10 MED ORDER — KETOROLAC TROMETHAMINE 15 MG/ML IJ SOLN
15.0000 mg | Freq: Once | INTRAMUSCULAR | Status: AC
  Administered 2019-11-10: 15 mg via INTRAVENOUS
  Filled 2019-11-10: qty 1
